# Patient Record
Sex: Female | Born: 1970 | Race: Black or African American | Hispanic: No | Marital: Single | Smoking: Never smoker
Health system: Southern US, Community
[De-identification: ages and names within clinical notes are randomized; demographics above are authoritative.]

## PROBLEM LIST (undated history)

## (undated) HISTORY — PX: COLPOSCOPY: SHX161

---

## 2013-12-17 LAB — HM PAP SMEAR

## 2017-07-14 ENCOUNTER — Emergency Department: Payer: No Typology Code available for payment source

## 2017-07-14 ENCOUNTER — Encounter: Payer: Self-pay | Admitting: Emergency Medicine

## 2017-07-14 ENCOUNTER — Other Ambulatory Visit: Payer: Self-pay

## 2017-07-14 DIAGNOSIS — M7918 Myalgia, other site: Secondary | ICD-10-CM | POA: Insufficient documentation

## 2017-07-14 DIAGNOSIS — M542 Cervicalgia: Secondary | ICD-10-CM | POA: Diagnosis present
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
## 2017-07-15 ENCOUNTER — Emergency Department: Payer: No Typology Code available for payment source

## 2017-07-15 ENCOUNTER — Emergency Department
Admission: EM | Admit: 2017-07-15 | Discharge: 2017-07-15 | Disposition: A | Discharge status: Home / Self Care | Payer: No Typology Code available for payment source | Attending: Emergency Medicine | Admitting: Emergency Medicine

## 2017-07-15 DIAGNOSIS — M7918 Myalgia, other site: Secondary | ICD-10-CM

## 2017-07-15 MED ORDER — TRAMADOL HCL 50 MG PO TABS: 50.0000 mg | ORAL_TABLET | Freq: Four times a day (QID) | ORAL | 0 refills | Status: DC | PRN

## 2017-07-15 MED ORDER — KETOROLAC TROMETHAMINE 60 MG/2ML IM SOLN
60.0000 mg | Freq: Once | INTRAMUSCULAR | Status: AC
  Administered 2017-07-15: 60 mg via INTRAMUSCULAR
  Filled 2017-07-15: qty 2

## 2017-07-15 MED ORDER — OXYCODONE-ACETAMINOPHEN 5-325 MG PO TABS
2.0000 | ORAL_TABLET | Freq: Once | ORAL | Status: AC
  Administered 2017-07-15: 2 via ORAL
  Filled 2017-07-15: qty 2

## 2017-07-15 NOTE — ED Notes (Signed)
Pt's gray necklace with key shape pendant removed from pt and given to pt's daughter

## 2017-07-15 NOTE — ED Notes (Addendum)
Patient transported to X-ray 

## 2017-07-15 NOTE — ED Notes (Signed)
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
## 2017-07-15 NOTE — Discharge Instructions (Addendum)
Please follow up with your primary care physician for further evaluation of your symptoms. Please return with any worsened condition or with any other concerns.

## 2017-07-15 NOTE — ED Notes (Signed)
Pt declines percocet medication at this time, pt states she would like to eat something first

## 2017-10-20 ENCOUNTER — Other Ambulatory Visit: Payer: Self-pay

## 2017-10-20 ENCOUNTER — Encounter: Payer: Self-pay | Admitting: Emergency Medicine

## 2017-10-20 ENCOUNTER — Emergency Department
Admission: EM | Admit: 2017-10-20 | Discharge: 2017-10-20 | Disposition: A | Discharge status: Home / Self Care | Payer: Self-pay | Attending: Emergency Medicine | Admitting: Emergency Medicine

## 2017-10-20 ENCOUNTER — Emergency Department: Payer: Self-pay

## 2017-10-20 DIAGNOSIS — I493 Ventricular premature depolarization: Secondary | ICD-10-CM | POA: Insufficient documentation

## 2017-10-20 DIAGNOSIS — R002 Palpitations: Secondary | ICD-10-CM

## 2017-10-20 DIAGNOSIS — D649 Anemia, unspecified: Secondary | ICD-10-CM | POA: Insufficient documentation

## 2017-10-20 DIAGNOSIS — R0602 Shortness of breath: Secondary | ICD-10-CM | POA: Insufficient documentation

## 2017-10-20 LAB — BASIC METABOLIC PANEL
Anion gap: 8 (ref 5–15)
BUN: 10 mg/dL (ref 6–20)
CALCIUM: 9.4 mg/dL (ref 8.9–10.3)
CHLORIDE: 105 mmol/L (ref 98–111)
CO2: 24 mmol/L (ref 22–32)
CREATININE: 0.66 mg/dL (ref 0.44–1.00)
GFR calc non Af Amer: >60 mL/min (ref >60)
GLUCOSE: 113 mg/dL — AB (ref 70–99)
Potassium: 3.4 mmol/L — ABNORMAL LOW (ref 3.5–5.1)
Sodium: 137 mmol/L (ref 135–145)

## 2017-10-20 LAB — CBC
HCT: 31.2 % — ABNORMAL LOW (ref 35.0–47.0)
Hemoglobin: 11 g/dL — ABNORMAL LOW (ref 12.0–16.0)
MCH: 31.8 pg (ref 26.0–34.0)
MCHC: 35.3 g/dL (ref 32.0–36.0)
MCV: 90 fL (ref 80.0–100.0)
Platelets: 410 10*3/uL (ref 150–440)
RBC: 3.47 MIL/uL — ABNORMAL LOW (ref 3.80–5.20)
RDW: 13.2 % (ref 11.5–14.5)
WBC: 6.4 10*3/uL (ref 3.6–11.0)

## 2017-10-20 LAB — TROPONIN I: Troponin I: <0.03 ng/mL (ref <0.03)

## 2017-10-20 MED ORDER — IRON 90 (18 FE) MG PO TABS: 1.0000 | ORAL_TABLET | Freq: Every day | ORAL | 2 refills | Status: AC
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
## 2017-10-20 NOTE — ED Triage Notes (Addendum)
Patient reports worsening shortness of breath with exertion for the last couple of months. States today after climbing stairs at work, she felt palpitations in her chest and reports her breathing never returned to normal as it has in the past. Patient reports heavy periods for the last two months. Reports changing tampon every 1-2 hours.

## 2017-10-20 NOTE — ED Notes (Signed)
Lavendar, green and blue top sent to lab

## 2017-10-20 NOTE — Discharge Instructions (Addendum)
Please seek medical attention for any high fevers, chest pain, shortness of breath, change in behavior, persistent vomiting, bloody stool or any other new or concerning symptoms.  

## 2017-10-20 NOTE — ED Notes (Signed)
Pt c/o having intermittent heart palpitations with SOB, pt also c/o having heavy periods more often, states she has been bleeding heavy for the past week and just had a period 2 weeks prior to that. States she has been having blurred vision, feeling lightheaded. Denies any palpitations at present.

## 2017-10-24 ENCOUNTER — Other Ambulatory Visit (HOSPITAL_COMMUNITY)
Admission: RE | Admit: 2017-10-24 | Discharge: 2017-10-24 | Disposition: A | Discharge status: Home / Self Care | Payer: Self-pay | Source: Ambulatory Visit | Attending: Obstetrics and Gynecology | Admitting: Obstetrics and Gynecology

## 2017-10-24 ENCOUNTER — Ambulatory Visit (INDEPENDENT_AMBULATORY_CARE_PROVIDER_SITE_OTHER): Payer: Self-pay | Admitting: Obstetrics and Gynecology

## 2017-10-24 ENCOUNTER — Encounter: Payer: Self-pay | Admitting: Obstetrics and Gynecology

## 2017-10-24 VITALS — BP 134/70 | HR 132 | Ht 70.0 in | Wt 200.0 lb

## 2017-10-24 DIAGNOSIS — N939 Abnormal uterine and vaginal bleeding, unspecified: Secondary | ICD-10-CM

## 2017-10-24 DIAGNOSIS — D509 Iron deficiency anemia, unspecified: Secondary | ICD-10-CM

## 2017-10-24 DIAGNOSIS — Z124 Encounter for screening for malignant neoplasm of cervix: Secondary | ICD-10-CM

## 2017-10-24 DIAGNOSIS — Z113 Encounter for screening for infections with a predominantly sexual mode of transmission: Secondary | ICD-10-CM | POA: Insufficient documentation

## 2017-10-24 LAB — POCT HEMOGLOBIN: HEMOGLOBIN: 8.9 g/dL — AB (ref 12.2–16.2)

## 2017-10-24 MED ORDER — MEDROXYPROGESTERONE ACETATE 10 MG PO TABS: 10.0000 mg | ORAL_TABLET | Freq: Every day | ORAL | 0 refills | Status: DC

## 2017-10-24 NOTE — Progress Notes (Signed)
Gynecology Abnormal Uterine Bleeding Initial Evaluation   Chief Complaint:  Chief Complaint  Patient presents with  . Menorrhagia    ER follow up    History of Present Illness:    Paitient is a 47 y.o. G1P1001 who LMP was Patient's last menstrual period was 10/20/2017., presents today for a problem visit.  She complains of menorrhagia that  began within the last few months and its severity is described as severe.  The patient menstrual complaints are acute.  She has previously experienced regular monthly cycles which have recently increased in both flow amount and duration and they are associated with moderate menstrual cramping.  She has used the following for attempts at control: tampon and pad.   Previous evaluation: none Prior Diagnosis: none. Previous Treatment: none.  LMP: Patient's last menstrual period was 10/20/2017.   Paramter Normal / Abnormal Prsent  Frequency [place]     Infrequent (>38 days)     Normal (?24 days ?38 days) X   Freequent (<24 days)    Duration Normal (?8 days)     Prolonged (>8 days) X  Regularity Regular (shortest to longest cycle variation ?7-9 days)*     Irregular (shortest to longest cycle variation ?8-10days)* X  Flow Volume Light    (Self reported) Normal     Heavy X      Intermenstrual Bleeding None X   Random     Cyclical early     Cyclical mid     Cyclical late        Unscheduled Bleeding  Not applicable X  (exogenous hormones) Absent     Present     FIGO AUB I System: *The available evidence suggests that, using these criteria, the normal range (shortest to longest) varies with age: 73-25 y of age, ?76 d; 68-41 y, ?7 d; and for 67-45 y, ?9 d    Review of Systems: Review of Systems  Constitutional: Positive for malaise/fatigue. Negative for chills, diaphoresis, fever and weight loss.  Gastrointestinal: Negative.   Genitourinary: Negative.   Skin: Negative.   Endo/Heme/Allergies: Does not bruise/bleed easily.    Past  Medical History:  History reviewed. No pertinent past medical history.  Past Surgical History:  Past Surgical History:  Procedure Laterality Date  . COLPOSCOPY      Obstetric History: G1P1001  Family History:  History reviewed. No pertinent family history.  Social History:  Social History   Socioeconomic History  . Marital status: Single    Spouse name: Not on file  . Number of children: Not on file  . Years of education: Not on file  . Highest education level: Not on file  Occupational History  . Not on file  Social Needs  . Financial resource strain: Not on file  . Food insecurity:    Worry: Not on file    Inability: Not on file  . Transportation needs:    Medical: Not on file    Non-medical: Not on file  Tobacco Use  . Smoking status: Never Smoker  . Smokeless tobacco: Never Used  Substance and Sexual Activity  . Alcohol use: Not Currently    Frequency: Never  . Drug use: Never  . Sexual activity: Not Currently    Birth control/protection: None  Lifestyle  . Physical activity:    Days per week: Not on file    Minutes per session: Not on file  . Stress: Not on file  Relationships  . Social connections:  Talks on phone: Not on file    Gets together: Not on file    Attends religious service: Not on file    Active member of club or organization: Not on file    Attends meetings of clubs or organizations: Not on file    Relationship status: Not on file  . Intimate partner violence:    Fear of current or ex partner: Not on file    Emotionally abused: Not on file    Physically abused: Not on file    Forced sexual activity: Not on file  Other Topics Concern  . Not on file  Social History Narrative  . Not on file    Allergies:  No Known Allergies  Medications: Prior to Admission medications   Medication Sig Start Date End Date Taking? Authorizing Provider  Ferrous Sulfate (IRON) 90 (18 Fe) MG TABS Take 1 tablet by mouth daily. Patient not taking:  Reported on 10/24/2017 10/20/17   Nance Pear, MD  medroxyPROGESTERone (PROVERA) 10 MG tablet Take 1 tablet (10 mg total) by mouth daily. 10/24/17   Malachy Mood, MD    Physical Exam Blood pressure 134/70, pulse (!) 132, height 5\' 10"  (1.778 m), weight 200 lb (90.7 kg), last menstrual period 10/20/2017.  Patient's last menstrual period was 10/20/2017.  General: NAD HEENT: normocephalic, anicteric Pulmonary: No increased work of breathing Abdomen: Soft, non-tender, non-distended.  Umbilicus without lesions.  No hepatomegaly, splenomegaly or masses palpable. No evidence of hernia  Genitourinary:  External: Normal external female genitalia.  Normal urethral meatus, normal Bartholin's and Skene's glands.    Vagina: Normal vaginal mucosa, no evidence of prolapse.    Cervix: Grossly normal in appearance, moderate amount of bleeding noted  Uterus: Non-enlarged, mobile, normal contour.  No CMT  Adnexa: ovaries non-enlarged, no adnexal masses  Rectal: deferred  Lymphatic: no evidence of inguinal lymphadenopathy Extremities: no edema, erythema, or tenderness Neurologic: Grossly intact Psychiatric: mood appropriate, affect full  Female chaperone present for pelvic portions of the physical exam  Assessment: 47 y.o. G1P1001 with abnormal uterine bleeding  Plan: Problem List Items Addressed This Visit    None    Visit Diagnoses    Iron deficiency anemia, unspecified iron deficiency anemia type    -  Primary   Relevant Orders   POCT hemoglobin (Completed)   Episode of heavy vaginal bleeding       Relevant Orders   POCT hemoglobin (Completed)   Routine screening for STI (sexually transmitted infection)       Relevant Orders   Cytology - PAP   Screening for malignant neoplasm of cervix       Relevant Orders   Cytology - PAP   Abnormal uterine bleeding       Relevant Orders   TSH+Prl+FSH+TestT+LH+DHEA S...   US PELVIS TRANSVANGINAL NON-OB (TV ONLY)      1) Discussed  management options for abnormal uterine bleeding including expectant, NSAIDs, tranexamic acid (Lysteda), oral progesterone (Provera, norethindrone, megace), Depo Provera, Levonorgestrel containing IUD, endometrial ablation (Novasure) or hysterectomy as definitive surgical management.  Discussed risks and benefits of each method.   Final management decision will hinge on results of patient's work up and whether an underlying etiology for the patients bleeding symptoms can be discerned.  We will conduct a basic work up examining using the PALM-COIEN classification system.  In the meantime the patient opts to trial provera while we await results of her ultrasound and labs.  The role of unopposed estrogen in the development of  endometrial hyperplasia or carcinoma is discussed.  The risk of endometrial hyperplasia is linearly correlated with increasing BMI given the production of estrone by adipose tissue. Printed patient education handouts were given to the patient to review at home.  Bleeding precautions reviewed.  - Pap 12/17/13 NIL, repeated today - Ultrasound ordered to evaluate for structural lesions - PCOS panel  - samples of fusion plus provided  2) Return in about 1 week (around 10/31/2017) for gyn TVUS and follow up.   Malachy Mood, MD, [facility], [place]

## 2017-10-27 ENCOUNTER — Other Ambulatory Visit: Payer: Self-pay

## 2017-10-27 ENCOUNTER — Emergency Department
Admission: EM | Admit: 2017-10-27 | Discharge: 2017-10-27 | Disposition: A | Discharge status: Home / Self Care | Payer: Self-pay | Attending: Emergency Medicine | Admitting: Emergency Medicine

## 2017-10-27 ENCOUNTER — Encounter: Payer: Self-pay | Admitting: Emergency Medicine

## 2017-10-27 ENCOUNTER — Emergency Department: Payer: Self-pay

## 2017-10-27 ENCOUNTER — Telehealth: Payer: Self-pay

## 2017-10-27 DIAGNOSIS — Z79899 Other long term (current) drug therapy: Secondary | ICD-10-CM | POA: Insufficient documentation

## 2017-10-27 DIAGNOSIS — R202 Paresthesia of skin: Secondary | ICD-10-CM

## 2017-10-27 DIAGNOSIS — H5789 Other specified disorders of eye and adnexa: Secondary | ICD-10-CM | POA: Insufficient documentation

## 2017-10-27 LAB — BASIC METABOLIC PANEL
Anion gap: 5 (ref 5–15)
BUN: 10 mg/dL (ref 6–20)
CHLORIDE: 107 mmol/L (ref 98–111)
CO2: 26 mmol/L (ref 22–32)
Calcium: 9.2 mg/dL (ref 8.9–10.3)
Creatinine, Ser: 0.74 mg/dL (ref 0.44–1.00)
GFR calc Af Amer: >60 mL/min (ref >60)
GFR calc non Af Amer: >60 mL/min (ref >60)
GLUCOSE: 114 mg/dL — AB (ref 70–99)
Potassium: 3.8 mmol/L (ref 3.5–5.1)
Sodium: 138 mmol/L (ref 135–145)

## 2017-10-27 LAB — CBC WITH DIFFERENTIAL/PLATELET
Basophils Absolute: 0 10*3/uL (ref 0–0.1)
Basophils Relative: 0 %
EOS PCT: 2 %
Eosinophils Absolute: 0.1 10*3/uL (ref 0–0.7)
HCT: 26.3 % — ABNORMAL LOW (ref 35.0–47.0)
Hemoglobin: 9.7 g/dL — ABNORMAL LOW (ref 12.0–16.0)
LYMPHS ABS: 2.1 10*3/uL (ref 1.0–3.6)
LYMPHS PCT: 29 %
MCH: 32.8 pg (ref 26.0–34.0)
MCHC: 36.8 g/dL — AB (ref 32.0–36.0)
MCV: 89 fL (ref 80.0–100.0)
MONO ABS: 0.3 10*3/uL (ref 0.2–0.9)
Monocytes Relative: 4 %
Neutro Abs: 4.7 10*3/uL (ref 1.4–6.5)
Neutrophils Relative %: 65 %
PLATELETS: 428 10*3/uL (ref 150–440)
RBC: 2.96 MIL/uL — ABNORMAL LOW (ref 3.80–5.20)
RDW: 13.4 % (ref 11.5–14.5)
WBC: 7.2 10*3/uL (ref 3.6–11.0)

## 2017-10-27 NOTE — Telephone Encounter (Signed)
Pt states [facility] wanted $1000 up front for blood work ordered for her.  Is all the blood work required?  She is trying to figure out how to resolve this. [phone]

## 2017-10-27 NOTE — ED Notes (Signed)
Pt to mri 

## 2017-10-27 NOTE — Telephone Encounter (Signed)
Please advise 

## 2017-10-27 NOTE — ED Notes (Signed)
Pt reports that she had a sharp pain in the left side of her head beside left eye - she states that now she has numbness from left eye to left ear that off and on radiates into center of head - this started at 11am Denies N/V - Denies blurred vision - Denies dizziness - Denies weakness - Denies slurred speech

## 2017-10-27 NOTE — ED Notes (Signed)
Pt given phone to be screened for MRI 

## 2017-10-27 NOTE — ED Triage Notes (Signed)
First Nurse Note:  C/O an episode of left temple pain followed by a feeling of numbness to left forehead and cheek.  Equal facial movement, speech clear, MAE equally and strong.  NAD

## 2017-10-27 NOTE — Telephone Encounter (Signed)
Yes in order to work it up completely these are the test results I would need to check.  I don't know if [facility] or anywhere else has any programs available for uninsured patient besides the discount for having a draw at [place]

## 2017-10-27 NOTE — Discharge Instructions (Addendum)
Return to the emergency department immediately for any worsening condition including any vision changes including blurry vision, double vision, decreased vision, any eye pain, especially with eye movement, any skin changes like redness or rash, fever, headache, or any new weakness or numbness of the face, arms or legs, or any slurred speech or confusion altered mental status.

## 2017-10-27 NOTE — ED Provider Notes (Signed)
[facility] Emergency Department Provider Note ____________________________________________   I have reviewed the triage vital signs and the triage nursing note.  HISTORY  Chief Complaint Numbness   Historian Patient  HPI Misty Stephens is a 47 y.o. female with no significant past medical history presents with acute onset of sharp pains around the left eye around 11 AM today followed by resolution of the pain, and noting numbness to the skin around the eye on the left side of the face up to the forehead on the left.  No skin rash or lesions.  No real vision problems in terms of blurry vision or double vision.  She does wear contacts.  No red eyes.  History of sinus congestion, but no recent sinus issues.  No facial droop.  No slurred speech.  No arm or leg weakness or numbness.     History reviewed. No pertinent past medical history.  There are no active problems to display for this patient.   Past Surgical History:  Procedure Laterality Date  . COLPOSCOPY      Prior to Admission medications   Medication Sig Start Date End Date Taking? Authorizing Provider  medroxyPROGESTERone (PROVERA) 10 MG tablet Take 1 tablet (10 mg total) by mouth daily. 10/24/17  Yes Malachy Mood, MD  Ferrous Sulfate (IRON) 90 (18 Fe) MG TABS Take 1 tablet by mouth daily. Patient not taking: Reported on 10/24/2017 10/20/17   Nance Pear, MD    No Known Allergies  No family history on file.  Social History Social History   Tobacco Use  . Smoking status: Never Smoker  . Smokeless tobacco: Never Used  Substance Use Topics  . Alcohol use: Not Currently    Frequency: Never  . Drug use: Never    Review of Systems  Constitutional: Negative for fever. Eyes: Negative for visual changes. ENT: Negative for sore throat. Cardiovascular: Negative for chest pain. Respiratory: Negative for shortness of breath. Gastrointestinal: Negative for abdominal pain, vomiting  and diarrhea. Genitourinary: Negative for dysuria. Musculoskeletal: Negative for back pain. Skin: Negative for rash. Neurological: Negative for headache.  ____________________________________________   PHYSICAL EXAM:  VITAL SIGNS: ED Triage Vitals  Enc Vitals Group     BP 10/27/17 1228 134/72     Pulse Rate 10/27/17 1228 93     Resp --      Temp 10/27/17 1228 99.3 F (37.4 C)     Temp Source 10/27/17 1228 Oral     SpO2 10/27/17 1228 100 %     Weight 10/27/17 1228 198 lb (89.8 kg)     Height 10/27/17 1228 5\' 10"  (1.778 m)     Head Circumference --      Peak Flow --      Pain Score 10/27/17 1234 0     Pain Loc --      Pain Edu? --      Excl. in [place]? --      Constitutional: Alert and oriented.  HEENT      Head: Normocephalic and atraumatic.      Eyes: Conjunctivae are normal. Pupils equal and round.  Funduscopic exam normal bilaterally.  Extraocular movements intact.  No pain with eye movement.      Ears:         Nose: No congestion/rhinnorhea.      Mouth/Throat: Mucous membranes are moist.      Neck: No stridor. Cardiovascular/Chest: Normal rate, regular rhythm.  No murmurs, rubs, or gallops. Respiratory: Normal respiratory effort without tachypnea nor  retractions. Breath sounds are clear and equal bilaterally. No wheezes/rales/rhonchi. Gastrointestinal: Soft. No distention, no guarding, no rebound. Nontender.    Genitourinary/rectal:Deferred Musculoskeletal: Nontender with normal range of motion in all extremities. No joint effusions.  No lower extremity tenderness.  No edema. Neurologic: No facial droop.  Paresthesia left forehead left cheek.  Normal tongue protrusion.  Normal speech and language.  5 and 5 strength in 4 extremities.  Coordination intact. Skin:  Skin is warm, dry and intact. No rash noted. Psychiatric: Mood and affect are normal. Speech and behavior are normal. Patient exhibits appropriate insight and  judgment.   ____________________________________________  LABS (pertinent positives/negatives) I, Lisa Roca, MD the attending physician have reviewed the labs noted below.  Labs Reviewed  BASIC METABOLIC PANEL - Abnormal; Notable for the following components:      Result Value   Glucose, Bld 114 (*)    All other components within normal limits  CBC WITH DIFFERENTIAL/PLATELET - Abnormal; Notable for the following components:   RBC 2.96 (*)    Hemoglobin 9.7 (*)    HCT 26.3 (*)    MCHC 36.8 (*)    All other components within normal limits    ____________________________________________    EKG I, Lisa Roca, MD, the attending physician have personally viewed and interpreted all ECGs.  None ____________________________________________  RADIOLOGY   CT head without contrast: Radiologist interpretation reviewed-normal head CT  MRI brain without contrast: Radiologist interpretation reviewed- IMPRESSION: Normal brain MRI. __________________________________________  PROCEDURES  Procedure(s) performed: None  Procedures  Critical Care performed: None   ____________________________________________  ED COURSE / ASSESSMENT AND PLAN  Pertinent labs & imaging results that were available during my care of the patient were reviewed by me and considered in my medical decision making (see chart for details).    No headache, does not seem to be related to migraine.  No active sinus issues.  No evidence of skin rash or shingles.  No eye pain itself, does not seem to be related to iritis or glaucoma.  Head CT negative.  We discussed obtaining MRI in case this is a sign of early ischemic stroke not seen on head CT.  Also discussed vasculitis or MS, etc.   MRI result reviewed and normal.  Uncertain etiology, but appears safe for discharge home.  I am going refer her for primary care follow-up and recommend neurology follow-up if this persist.    CONSULTATIONS:    None   Patient / Family / Caregiver informed of clinical course, medical decision-making process, and agree with plan.   I discussed return precautions, follow-up instructions, and discharge instructions with patient and/or family.  Discharge Instructions : Return to the emergency department immediately for any worsening condition including any vision changes including blurry vision, double vision, decreased vision, any eye pain, especially with eye movement, any skin changes like redness or rash, fever, headache, or any new weakness or numbness of the face, arms or legs, or any slurred speech or confusion altered mental status.    ___________________________________________   FINAL CLINICAL IMPRESSION(S) / ED DIAGNOSES   Final diagnoses:  Paresthesia      ___________________________________________         Note: This dictation was prepared with Dragon dictation. Any transcriptional errors that result from this process are unintentional    Lisa Roca, MD 10/27/17 [phone]

## 2017-10-28 LAB — CYTOLOGY - PAP
Chlamydia: NEGATIVE
Diagnosis: NEGATIVE
HPV (WINDOPATH): NOT DETECTED
NEISSERIA GONORRHEA: NEGATIVE

## 2017-10-28 NOTE — Telephone Encounter (Signed)
Patient is calling to follow up about message left. Please advise

## 2017-10-28 NOTE — Telephone Encounter (Signed)
PT aware of AMS message. I did advise her to keep the U/[place] appointment. Front desk to call her with [place] of upcoming appointment. KJ CMA

## 2017-11-07 ENCOUNTER — Other Ambulatory Visit (HOSPITAL_COMMUNITY)
Admission: RE | Admit: 2017-11-07 | Discharge: 2017-11-07 | Disposition: A | Discharge status: Home / Self Care | Payer: Self-pay | Source: Ambulatory Visit | Attending: Obstetrics and Gynecology | Admitting: Obstetrics and Gynecology

## 2017-11-07 ENCOUNTER — Ambulatory Visit (INDEPENDENT_AMBULATORY_CARE_PROVIDER_SITE_OTHER): Payer: Self-pay | Admitting: Obstetrics and Gynecology

## 2017-11-07 ENCOUNTER — Encounter: Payer: Self-pay | Admitting: Obstetrics and Gynecology

## 2017-11-07 ENCOUNTER — Ambulatory Visit (INDEPENDENT_AMBULATORY_CARE_PROVIDER_SITE_OTHER): Payer: Self-pay

## 2017-11-07 VITALS — BP 116/70 | HR 114 | Ht 70.0 in | Wt 202.0 lb

## 2017-11-07 DIAGNOSIS — N83202 Unspecified ovarian cyst, left side: Secondary | ICD-10-CM

## 2017-11-07 DIAGNOSIS — N939 Abnormal uterine and vaginal bleeding, unspecified: Secondary | ICD-10-CM

## 2017-11-07 DIAGNOSIS — R9389 Abnormal findings on diagnostic imaging of other specified body structures: Secondary | ICD-10-CM

## 2017-11-07 DIAGNOSIS — D252 Subserosal leiomyoma of uterus: Secondary | ICD-10-CM

## 2017-11-07 NOTE — Progress Notes (Signed)
Gynecology Ultrasound Follow Up  Chief Complaint:  Chief Complaint  Patient presents with  . Follow-up     History of Present Illness: Patient is a 47 y.o. female who presents today for ultrasound evaluation of AUB .  Ultrasound demonstrates the following findgins Adnexa: no masses seen  Uterus: Non-enlarged, small subserosal fibroid with endometrial stripe  Markedly thickended Additional: no free fluid  Review of Systems: Review of Systems  Constitutional: Negative.   Respiratory: Positive for shortness of breath.   Cardiovascular: Positive for palpitations. Negative for chest pain.  Gastrointestinal: Negative.   Neurological: Positive for dizziness.    Past Medical History:  History reviewed. No pertinent past medical history.  Past Surgical History:  Past Surgical History:  Procedure Laterality Date  . COLPOSCOPY      Gynecologic History:  Patient's last menstrual period was 10/20/2017. Contraception: oral progesterone-only contraceptive Last Pap: 10/24/2017 Results were: .no abnormalities  Family History:  History reviewed. No pertinent family history.  Social History:  Social History   Socioeconomic History  . Marital status: Single    Spouse name: Not on file  . Number of children: Not on file  . Years of education: Not on file  . Highest education level: Not on file  Occupational History  . Not on file  Social Needs  . Financial resource strain: Not on file  . Food insecurity:    Worry: Not on file    Inability: Not on file  . Transportation needs:    Medical: Not on file    Non-medical: Not on file  Tobacco Use  . Smoking status: Never Smoker  . Smokeless tobacco: Never Used  Substance and Sexual Activity  . Alcohol use: Not Currently    Frequency: Never  . Drug use: Never  . Sexual activity: Not Currently    Birth control/protection: None  Lifestyle  . Physical activity:    Days per week: Not on file    Minutes per session: Not on  file  . Stress: Not on file  Relationships  . Social connections:    Talks on phone: Not on file    Gets together: Not on file    Attends religious service: Not on file    Active member of club or organization: Not on file    Attends meetings of clubs or organizations: Not on file    Relationship status: Not on file  . Intimate partner violence:    Fear of current or ex partner: Not on file    Emotionally abused: Not on file    Physically abused: Not on file    Forced sexual activity: Not on file  Other Topics Concern  . Not on file  Social History Narrative  . Not on file    Allergies:  No Known Allergies  Medications: Prior to Admission medications   Medication Sig Start Date End Date Taking? Authorizing Provider  Ferrous Sulfate (IRON) 90 (18 Fe) MG TABS Take 1 tablet by mouth daily. 10/20/17  Yes Nance Pear, MD  medroxyPROGESTERone (PROVERA) 10 MG tablet Take 1 tablet (10 mg total) by mouth daily. 10/24/17  Yes Malachy Mood, MD    Physical Exam Vitals: Blood pressure 116/70, pulse (!) 114, height 5\' 10"  (1.778 m), weight 202 lb (91.6 kg), last menstrual period 10/20/2017.  General: NAD HEENT: normocephalic, anicteric Pulmonary: No increased work of breathing Extremities: no edema, erythema, or tenderness Neurologic: Grossly intact, normal gait Psychiatric: mood appropriate, affect full   ENDOMETRIAL BIOPSY  The indications for endometrial biopsy were reviewed.   Risks of the biopsy including cramping, bleeding, infection, uterine perforation, inadequate specimen and need for additional procedures  were discussed. The patient states she understands and agrees to undergo procedure today. Consent was signed. Time out was performed. Urine HCG was negative. A Graves speculum was placed and the cervix was brought into view.  The cervix was prepped with Betadine. A single-toothed tenaculum was not placed on the anterior lip of the cervix for traction. A 3 mm  pipelle was introduced through the cervix into the endometrial cavity without difficulty to a depth of 8cm, and a large amount of tissue was obtained, the resulting specime sent to pathology. The instruments were removed from the patient's vagina. Minimal bleeding from the cervix was noted. The patient tolerated the procedure well. Routine post-procedure instructions were given to the patient.  She will be contacted by phone one results become available.     Assessment: 47 y.o. G1P1001 follow up AUB  Plan: Problem List Items Addressed This Visit    None    Visit Diagnoses    Abnormal uterine bleeding    -  Primary   Relevant Orders   Surgical pathology   Thickened endometrium          1) AUB - significantly thickened endometrium.  Otherwise no visualized uterine pathology.  Endometrial biopsy obtained to rule out hyperplasia, malignancy, or endometritis.  In the meantime continue Provera.  Suspect potential endometrial polyp  2) A total of 15 minutes were spent in face-to-face contact with the patient during this encounter with over half of that time devoted to counseling and coordination of care.  3) Return in about 4 weeks (around 12/05/2017) for medication follow up. up   Malachy Mood, MD, [facility], [facility] 11/07/2017, 10:39 PM

## 2017-11-10 ENCOUNTER — Telehealth: Payer: Self-pay | Admitting: Obstetrics and Gynecology

## 2017-11-10 NOTE — Telephone Encounter (Signed)
Not related to tampon, also cramping from biopsy is usually very self limited.  She has been bleeding for past month which is why we did the biopsy will call once biopsy results are back and reviewed

## 2017-11-10 NOTE — Telephone Encounter (Signed)
Patient had biopsy since Friday, today being the worst day of the cramping.  She did use tampon all weekend.  She is worried that wearing one has caused the cramping.  Please call.

## 2017-11-10 NOTE — Telephone Encounter (Signed)
Please advise 

## 2017-11-10 NOTE — Telephone Encounter (Signed)
Pt aware and will wait for results

## 2017-11-12 ENCOUNTER — Telehealth: Payer: Self-pay

## 2017-11-12 NOTE — Telephone Encounter (Signed)
Please advise. Pt is aware you are out of office until tomorrow 10/10
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
## 2017-11-13 ENCOUNTER — Encounter: Payer: Self-pay | Admitting: Obstetrics and Gynecology

## 2017-11-14 NOTE — Telephone Encounter (Signed)
-----   Message from Malachy Mood, MD sent at 11/13/2017  9:23 AM EDT ----- Regarding: surgery Surgery Date:   LOS: same day surgery  Surgery Booking Request Patient Full Name: Kaleah Hagemeister MRN: [id number]  DOB: December 09, 1970  Surgeon: Malachy Mood, MD  Requested Surgery Date and Time: in 2 weeks Primary Diagnosis and Code: AUB Secondary Diagnosis and Code:  Surgical Procedure: hysteroscopy, D&C, novasure L&D Notification:N/A Admission Status: same day surgery Length of Surgery: 1hr Special Case Needs: none H&P:  (date) Phone Interview or Office Pre-Admit: phone interview Interpreter: No Language: English Medical Clearance: No Special Scheduling Instructions: self pay

## 2017-11-14 NOTE — Telephone Encounter (Signed)
Patient is unsure about having the surgery since self pay. Patient would like a call back to discuss other options for the bleeding.

## 2017-11-18 ENCOUNTER — Other Ambulatory Visit: Payer: Self-pay | Admitting: Obstetrics and Gynecology

## 2017-11-18 MED ORDER — NORETHINDRONE ACETATE 5 MG PO TABS: 5.0000 mg | ORAL_TABLET | Freq: Every day | ORAL | 11 refills | Status: DC

## 2017-11-24 ENCOUNTER — Telehealth: Payer: Self-pay

## 2017-11-24 NOTE — Telephone Encounter (Signed)
Pt has questions regarding Rx she just picked up on Friday, and she would like to speak to you before she starts it. CB# [phone]

## 2017-12-05 ENCOUNTER — Ambulatory Visit: Payer: Self-pay | Admitting: Obstetrics and Gynecology

## 2017-12-17 ENCOUNTER — Telehealth: Payer: Self-pay | Admitting: Obstetrics and Gynecology

## 2017-12-17 NOTE — Telephone Encounter (Signed)
Patient is calling needing to speak with Dr. Georgianne Fick about an medication he prescribe. She states is it not work and she is still actively bleeding . Please call patient. Thank you

## 2017-12-19 ENCOUNTER — Ambulatory Visit: Payer: Self-pay | Admitting: Obstetrics and Gynecology

## 2018-01-23 ENCOUNTER — Ambulatory Visit: Payer: Self-pay | Admitting: Obstetrics and Gynecology

## 2018-12-04 ENCOUNTER — Telehealth: Payer: Self-pay

## 2018-12-04 NOTE — Telephone Encounter (Signed)
Pt calling triage today. Tried to call her back but no answer. Pt needs to be seen since she has not been seen in over a year. Please schedule. This is regarding some hemorrhaging

## 2019-01-22 ENCOUNTER — Ambulatory Visit: Payer: Self-pay | Admitting: Obstetrics and Gynecology

## 2019-01-22 ENCOUNTER — Encounter: Payer: Self-pay | Admitting: Obstetrics and Gynecology

## 2019-01-22 ENCOUNTER — Other Ambulatory Visit: Payer: Self-pay

## 2019-01-22 ENCOUNTER — Encounter

## 2019-01-22 VITALS — BP 138/76 | HR 95 | Ht 70.0 in | Wt 206.0 lb

## 2019-01-22 DIAGNOSIS — N924 Excessive bleeding in the premenopausal period: Secondary | ICD-10-CM

## 2019-01-22 DIAGNOSIS — Z1211 Encounter for screening for malignant neoplasm of colon: Secondary | ICD-10-CM

## 2019-01-22 DIAGNOSIS — Z1239 Encounter for other screening for malignant neoplasm of breast: Secondary | ICD-10-CM

## 2019-01-22 DIAGNOSIS — Z01419 Encounter for gynecological examination (general) (routine) without abnormal findings: Secondary | ICD-10-CM

## 2019-01-22 NOTE — Patient Instructions (Signed)
[facility] [address] [place] [address]  [facility]  [address] [place] [address]  Phone: [phone]  
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
## 2019-01-23 LAB — CBC
Hematocrit: 33.4 % — ABNORMAL LOW (ref 34.0–46.6)
Hemoglobin: 11.2 g/dL (ref 11.1–15.9)
MCH: 30.4 pg (ref 26.6–33.0)
MCHC: 33.5 g/dL (ref 31.5–35.7)
MCV: 91 fL (ref 79–97)
Platelets: 332 10*3/uL (ref 150–450)
RBC: 3.68 x10E6/uL — ABNORMAL LOW (ref 3.77–5.28)
RDW: 12.3 % (ref 11.7–15.4)
WBC: 6.4 10*3/uL (ref 3.4–10.8)

## 2019-02-12 ENCOUNTER — Encounter: Payer: Self-pay | Admitting: *Deleted

## 2019-07-02 ENCOUNTER — Ambulatory Visit: Payer: Self-pay | Attending: Internal Medicine

## 2019-07-02 DIAGNOSIS — Z23 Encounter for immunization: Secondary | ICD-10-CM

## 2019-07-02 NOTE — Progress Notes (Signed)
   Covid-19 Vaccination Clinic  Name:  Misty Stephens    MRN: [id number] DOB: 10-10-1970  07/02/2019  [name] was observed post Covid-19 immunization for 15 minutes without incident. She was provided with Vaccine Information Sheet and instruction to access the V-Safe system.   [name] was instructed to call 911 with any severe reactions post vaccine: Marland Kitchen Difficulty breathing  . Swelling of face and throat  . A fast heartbeat  . A bad rash all over body  . Dizziness and weakness   Immunizations Administered    Name Date Dose VIS Date Route   Pfizer COVID-19 Vaccine 07/02/2019  9:57 AM 0.3 mL 03/31/2018 Intramuscular   Manufacturer: [place]   Lot: [id number]   [place]: [id number]

## 2019-07-23 ENCOUNTER — Ambulatory Visit: Payer: Self-pay | Attending: Internal Medicine

## 2019-07-23 DIAGNOSIS — Z23 Encounter for immunization: Secondary | ICD-10-CM

## 2019-07-23 NOTE — Progress Notes (Signed)
   Covid-19 Vaccination Clinic  Name:  Misty Stephens    MRN: [id number] DOB: 1970-02-28  07/23/2019  [name] was observed post Covid-19 immunization for 15 minutes without incident. She was provided with Vaccine Information Sheet and instruction to access the V-Safe system.   [name] was instructed to call 911 with any severe reactions post vaccine: Marland Kitchen Difficulty breathing  . Swelling of face and throat  . A fast heartbeat  . A bad rash all over body  . Dizziness and weakness   Immunizations Administered    Name Date Dose VIS Date Route   Pfizer COVID-19 Vaccine 07/23/2019  8:26 AM 0.3 mL 03/31/2018 Intramuscular   Manufacturer: [place]   Lot: [id number]   [place]: [id number]
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
## 2020-03-26 IMAGING — CR DG CHEST 2V
2 series · 2 of 2 positions shown · non-contrast
Comparison: Chest x-ray of July 15, 2017

CLINICAL DATA: Worsening exertional dyspnea for the past several
months with severe episode associated with palpitation when climbing
stairs today. Menorrhagia for the past 2 months. Never smoked.

EXAM:
CHEST - 2 VIEW

[chest pa]
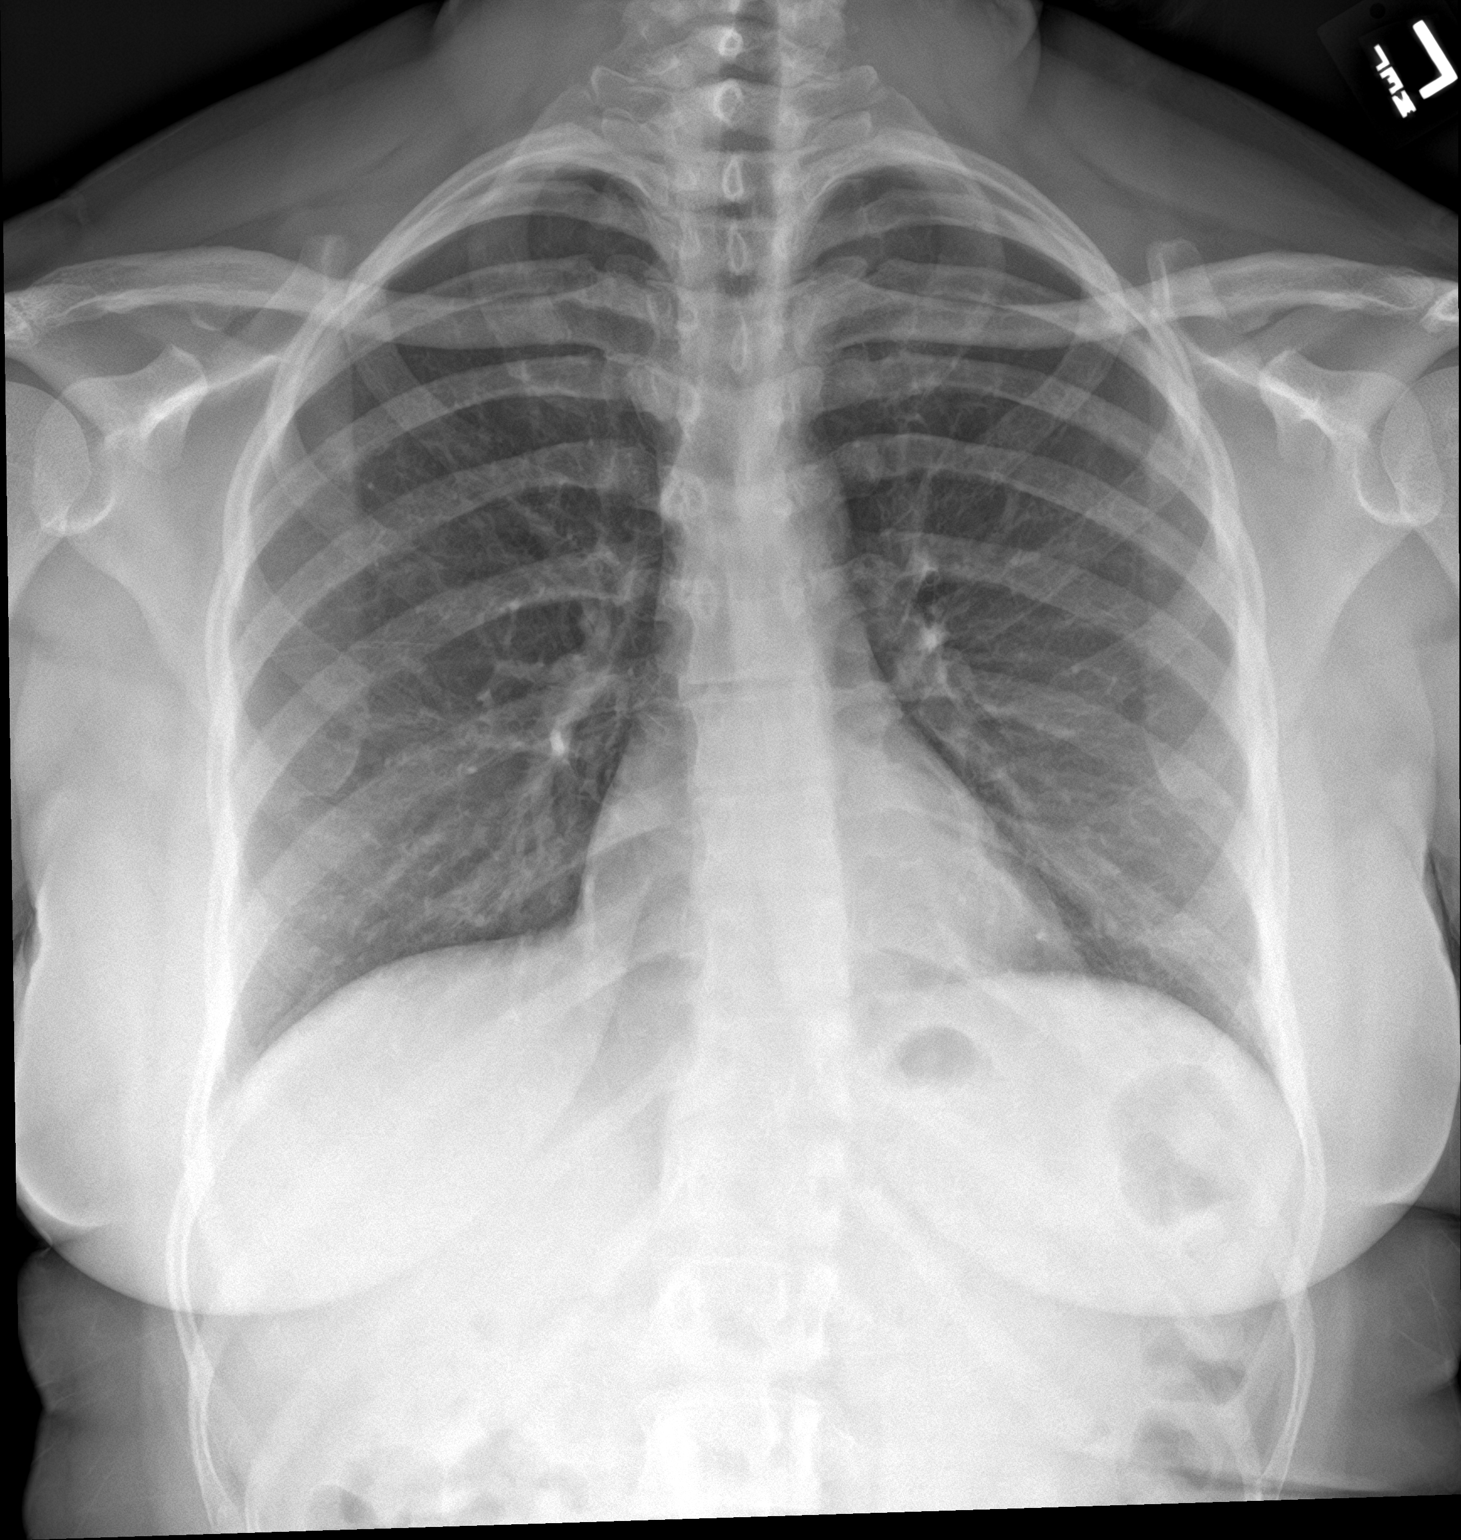

[chest lat]
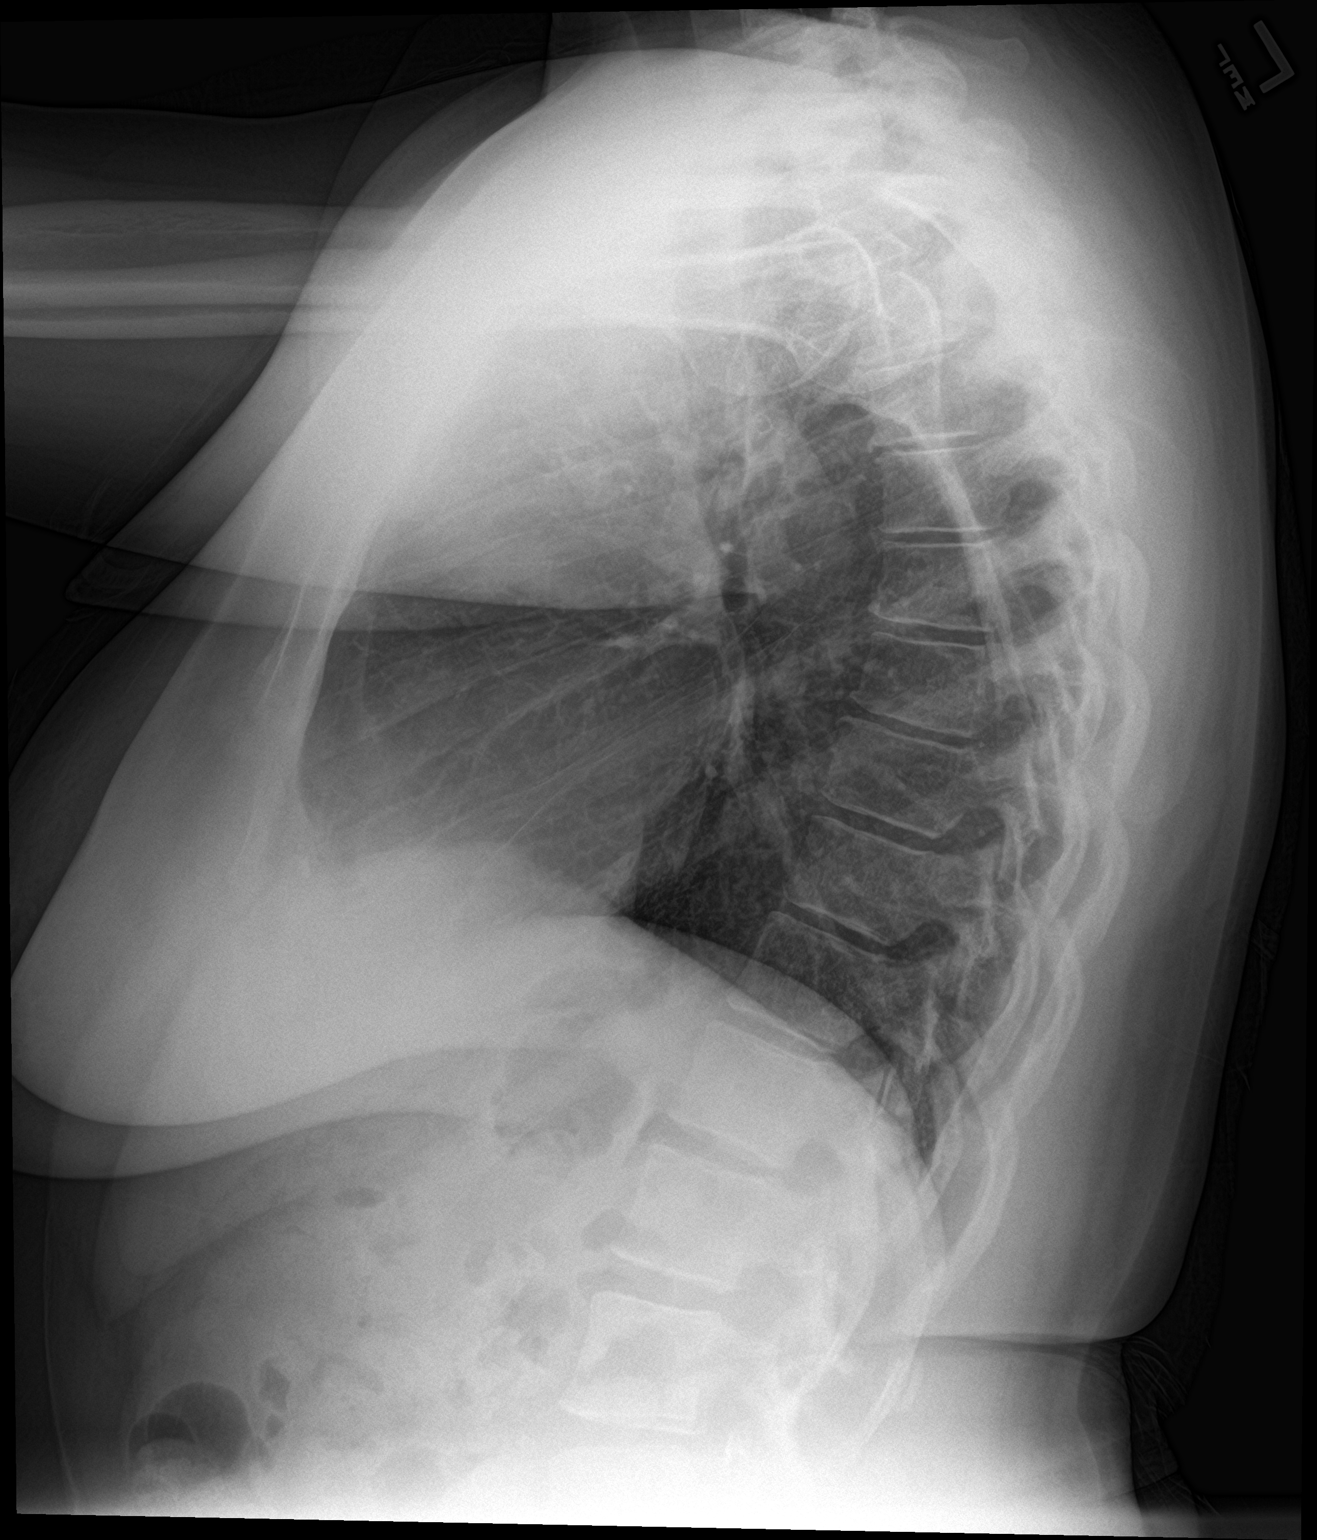

[2 of 2 positions shown; findings below may reference images not displayed]

FINDINGS: The lungs are adequately inflated. There is no focal infiltrate.
There is no pleural effusion. The heart and pulmonary vascularity
are normal. The mediastinum is normal in width. There is chronic
gentle levocurvature centered at approximately T10 or T11.
IMPRESSION: There is no active cardiopulmonary disease.

## 2020-04-24 ENCOUNTER — Ambulatory Visit: Payer: Self-pay | Admitting: Obstetrics and Gynecology

## 2020-11-07 ENCOUNTER — Telehealth: Payer: Self-pay

## 2020-11-07 NOTE — Telephone Encounter (Signed)
Pt calling; needs date of last mammogram; do we still have films?  [phone]  Adv pt we need her to sign a ROI and CD could be sent directly to facility.  It was decided that a ROI would be mailed to pt and she will fax it back.

## 2020-11-08 NOTE — Telephone Encounter (Signed)
ROI had been mailed.

## 2020-12-01 ENCOUNTER — Telehealth: Payer: Self-pay

## 2020-12-01 NOTE — Telephone Encounter (Signed)
Kim from [facility] calling; trying to get breast images on pt for comparison. [phone]

## 2020-12-01 NOTE — Telephone Encounter (Signed)
LMVM TRC. Advised patient contacted our office a few weeks ago inquiring how to get past Mammogram images. She was advised to complete a release of information form. The form was mailed to her for completion as she stated she was unable to make it to our office during business hours. A disc with previous images is/has been ready since the day she called. We have not received the release of information form from the patient or a faxed request from any medical facility.

## 2023-11-28 LAB — HM MAMMOGRAPHY

## 2023-12-25 ENCOUNTER — Encounter: Payer: Self-pay | Admitting: Family Medicine
